# Patient Record
Sex: Female | Born: 1995 | Race: White | Hispanic: No | Marital: Single | Smoking: Current every day smoker
Health system: Southern US, Community
[De-identification: ages and names within clinical notes are randomized; demographics above are authoritative.]

---
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
## 2016-01-08 ENCOUNTER — Ambulatory Visit (INDEPENDENT_AMBULATORY_CARE_PROVIDER_SITE_OTHER): Payer: Medicaid Other | Admitting: Podiatry

## 2016-01-08 ENCOUNTER — Encounter: Payer: Self-pay | Admitting: Podiatry

## 2016-01-08 VITALS — BP 98/71 | HR 99 | Resp 16 | Ht 67.0 in | Wt 110.0 lb

## 2016-01-08 DIAGNOSIS — L923 Foreign body granuloma of the skin and subcutaneous tissue: Secondary | ICD-10-CM | POA: Diagnosis not present

## 2016-01-08 NOTE — Progress Notes (Signed)
   Subjective:    Patient ID: Crystal Crosby, female    DOB: 04/23/1996, 20 y.o.   MRN: [id number]  HPI Patient presents with foot pain in their right foot-medial (near arch). Pt stated, "Stepped on glass 4-5 yrs ago; Pain on and off"; went to ED on 11/04/15.  Patient had x-rays taken at [facility] on 11/04/15.    Review of Systems  All other systems reviewed and are negative.      Objective:   Physical Exam        Assessment & Plan:

## 2016-01-10 NOTE — Progress Notes (Signed)
Subjective:     Patient ID: Crystal Crosby, female   DOB: [name], 20 y.o.   MRN: [id number]  HPI patient presents stating that she stepped on glass for 5 years ago and that she has developed recently pain in the inside of her right arch with inflammation and no drainage. She states a small bump has come up recently and it's tender to wear shoes and was referred by the emergency room   Review of Systems  All other systems reviewed and are negative.      Objective:   Physical Exam  Constitutional: She is oriented to person, place, and time.  Cardiovascular: Intact distal pulses.   Musculoskeletal: Normal range of motion.  Neurological: She is oriented to person, place, and time.  Skin: Skin is warm.  Nursing note and vitals reviewed.  neurovascular status found to be intact muscle strength adequate range of motion within normal limits. Patient's found have good digital perfusion is well oriented 3 with well-developed arch. On the medial side of the right arch there is a small protrusion and within this there is slight redness and it is painful when pressed. When x-rays evaluated it appears to be related to one large piece of glass which is probably moved and is protruding. Patient has several other small pieces of glass that are not painful     Assessment:     Probable movement of a piece of glass which is creating irritation of tissue especially with compression    Plan:     H&P and her x-rays reviewed with her. I explained and educated her on condition and consideration for glass removal and due to my schedule not been available for the next several weeks I am referring her to Dr. Marylene LandStover for evaluation and treatment and probable glass removal. Patient is scheduled for Monday to see Dr. Marylene LandStover

## 2016-01-11 ENCOUNTER — Ambulatory Visit: Payer: Medicaid Other | Admitting: Sports Medicine

## 2017-01-07 IMAGING — DX DG FOOT COMPLETE 3+V*R*
3 series · 3 of 3 positions shown · non-contrast
Comparison: None.

CLINICAL DATA: Blister on medial aspect of right foot which
ruptured in [REDACTED]. Evaluate for gas.

EXAM:
RIGHT FOOT COMPLETE - 3+ VIEW

[foot ap]
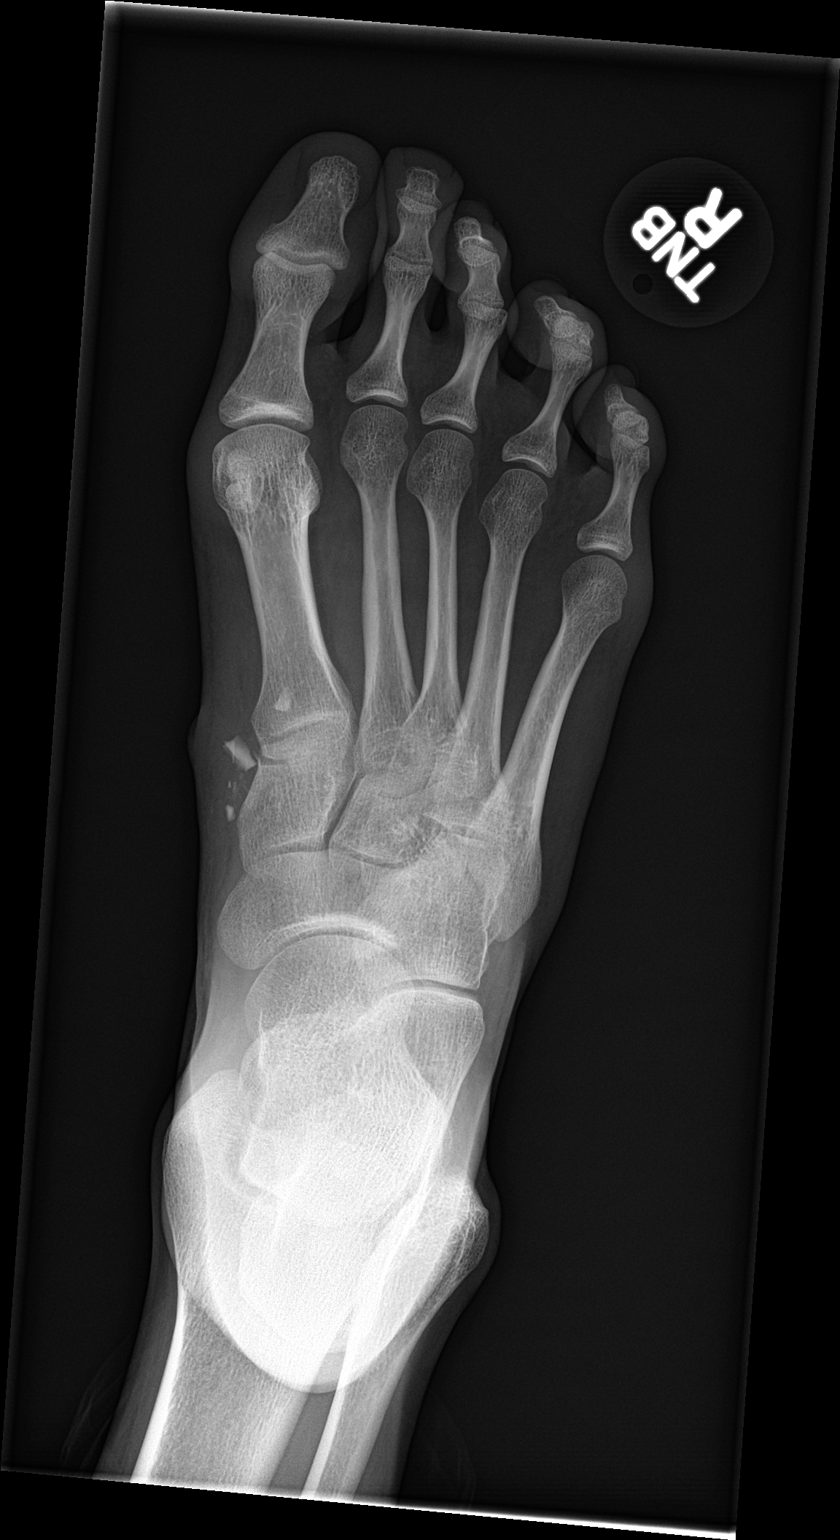

[foot obl]
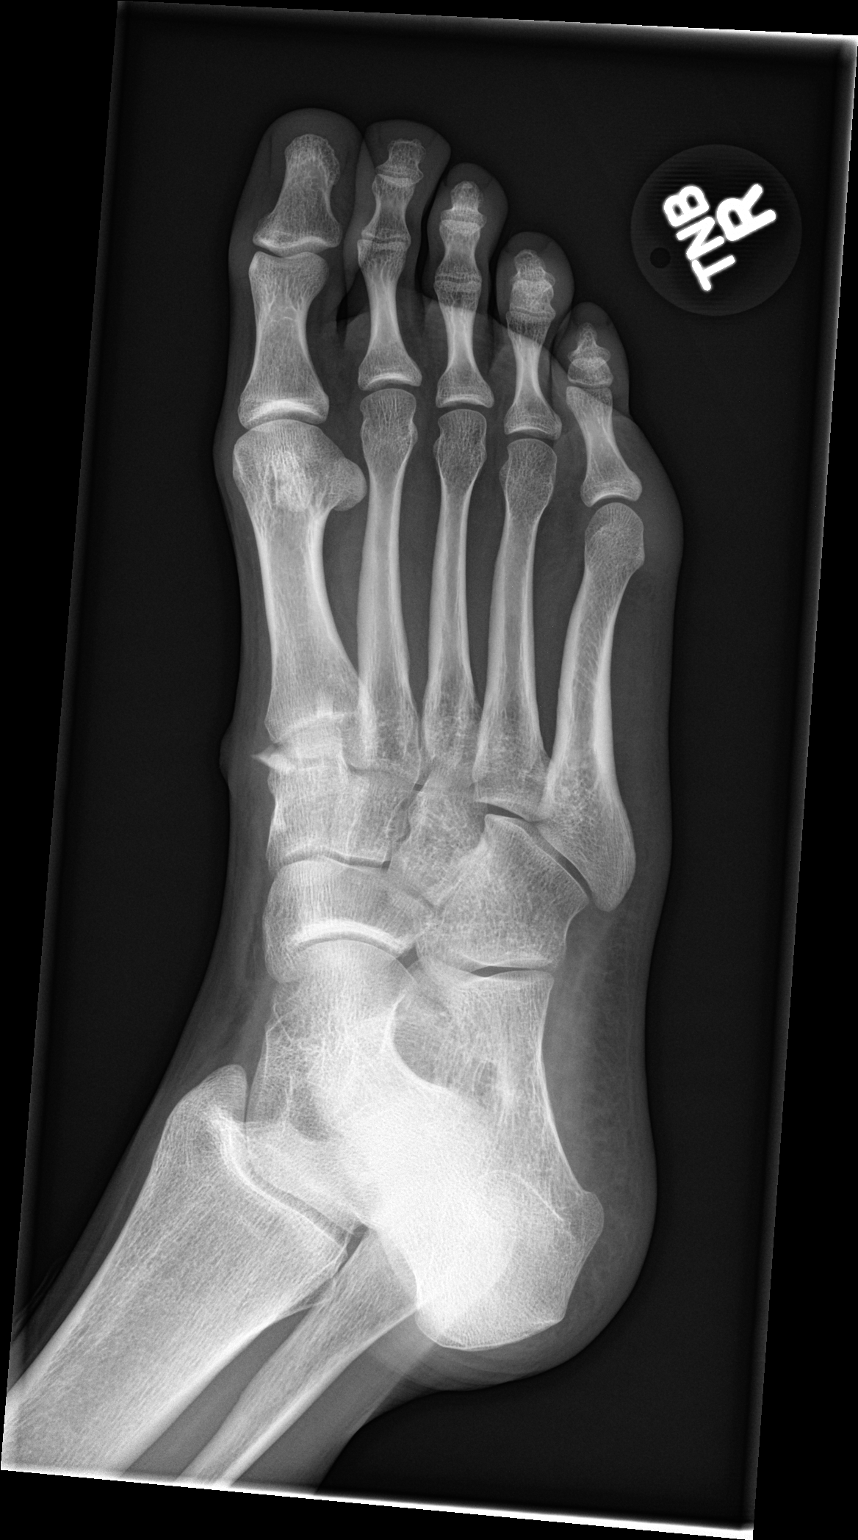

[foot lat]
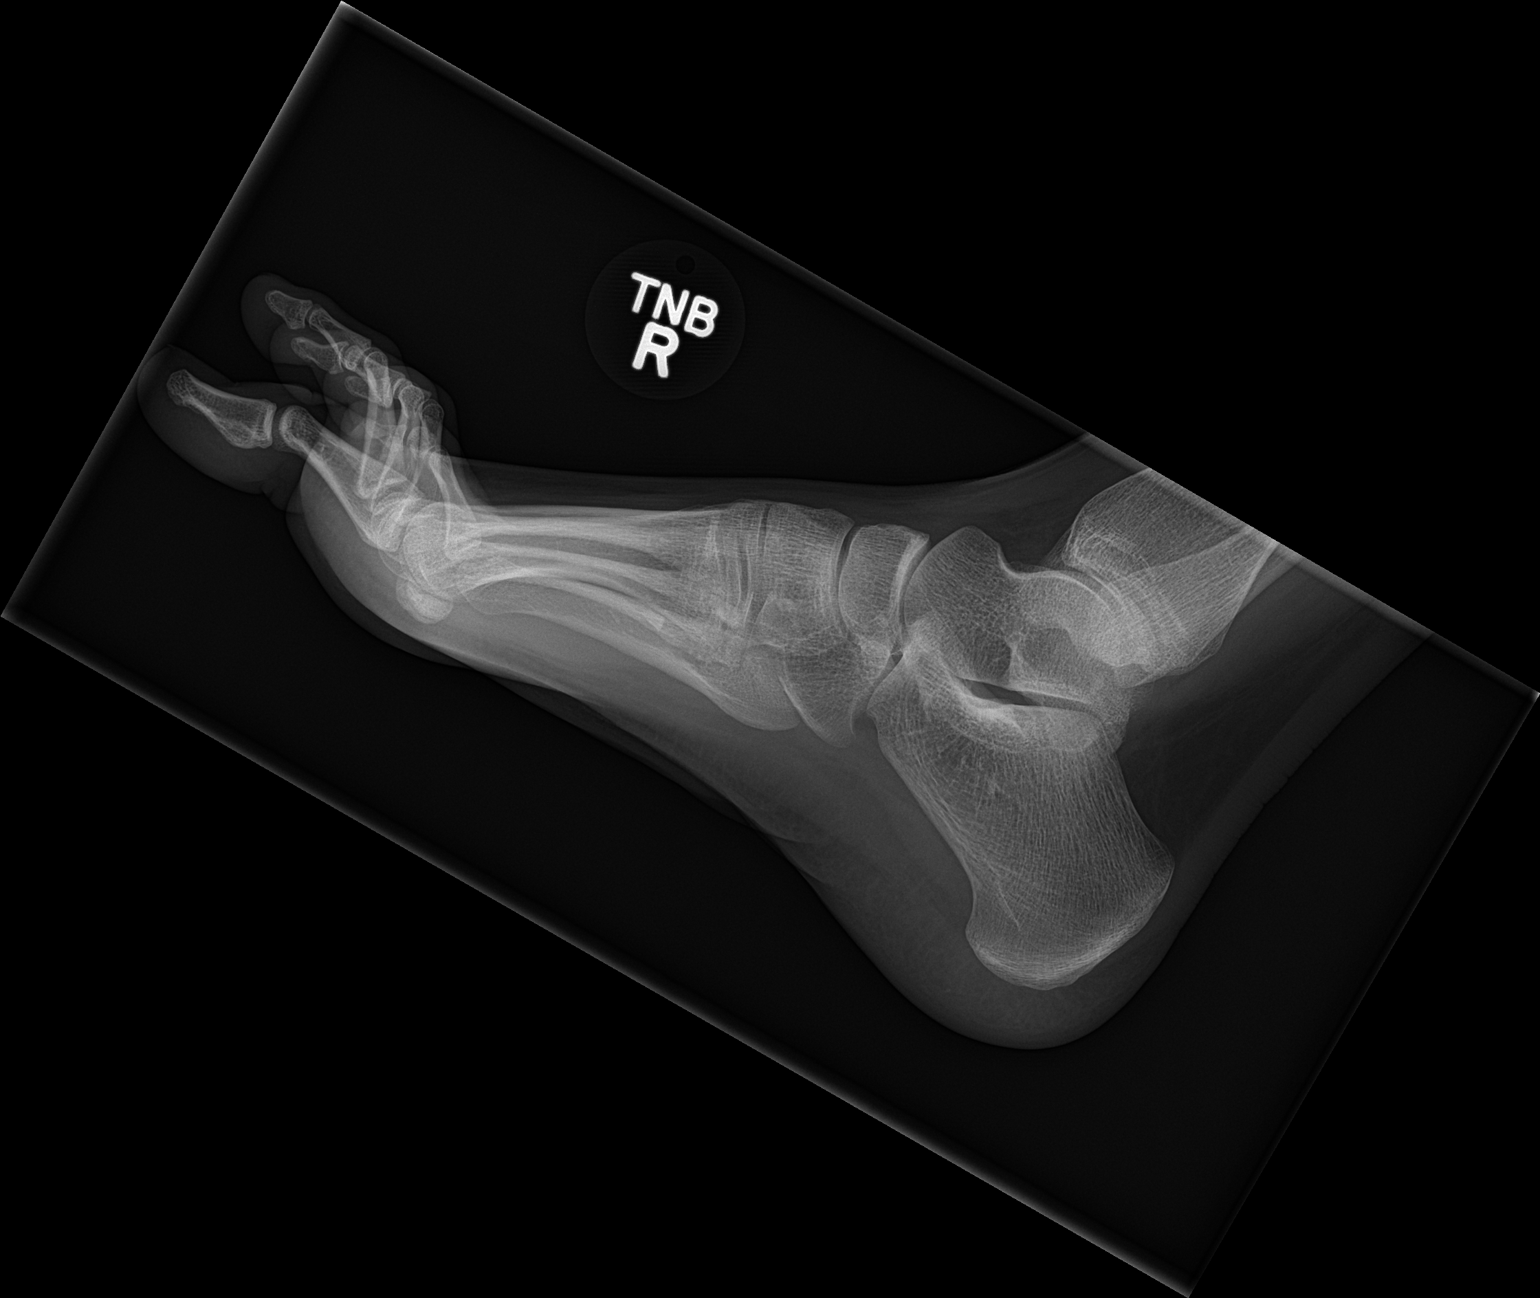

[3 of 3 positions shown; findings below may reference images not displayed]

FINDINGS: Radiopaque foreign bodies are noted within the medial soft tissues
adjacent to the medial cuneiform and proximal first metatarsal.
Overlying soft tissue swelling. No underlying acute bony
abnormality. No fracture, subluxation or dislocation. No
radiographic changes of osteomyelitis. No soft tissue gas.
IMPRESSION: Radiopaque foreign bodies within the medial midfoot soft tissues. No
acute bony abnormality.

## 2018-03-15 ENCOUNTER — Encounter (HOSPITAL_COMMUNITY): Payer: Self-pay | Admitting: Emergency Medicine

## 2018-03-15 ENCOUNTER — Other Ambulatory Visit: Payer: Self-pay

## 2018-03-15 ENCOUNTER — Emergency Department (HOSPITAL_COMMUNITY): Payer: Self-pay

## 2018-03-15 ENCOUNTER — Emergency Department (HOSPITAL_COMMUNITY)
Admission: EM | Admit: 2018-03-15 | Discharge: 2018-03-15 | Disposition: A | Discharge status: Home / Self Care | Payer: Self-pay | Attending: Emergency Medicine | Admitting: Emergency Medicine

## 2018-03-15 DIAGNOSIS — M795 Residual foreign body in soft tissue: Secondary | ICD-10-CM | POA: Insufficient documentation

## 2018-03-15 DIAGNOSIS — Z23 Encounter for immunization: Secondary | ICD-10-CM | POA: Insufficient documentation

## 2018-03-15 DIAGNOSIS — S90851A Superficial foreign body, right foot, initial encounter: Secondary | ICD-10-CM

## 2018-03-15 DIAGNOSIS — Z79899 Other long term (current) drug therapy: Secondary | ICD-10-CM | POA: Insufficient documentation

## 2018-03-15 DIAGNOSIS — L089 Local infection of the skin and subcutaneous tissue, unspecified: Secondary | ICD-10-CM | POA: Insufficient documentation

## 2018-03-15 DIAGNOSIS — Z1881 Retained glass fragments: Secondary | ICD-10-CM | POA: Insufficient documentation

## 2018-03-15 LAB — CBC WITH DIFFERENTIAL/PLATELET
ABS IMMATURE GRANULOCYTES: 0 10*3/uL (ref 0.0–0.1)
BASOS ABS: 0 10*3/uL (ref 0.0–0.1)
Basophils Relative: 0 %
Eosinophils Absolute: 0.1 10*3/uL (ref 0.0–0.7)
Eosinophils Relative: 1 %
HCT: 39.5 % (ref 36.0–46.0)
HEMOGLOBIN: 13.2 g/dL (ref 12.0–15.0)
Immature Granulocytes: 0 %
LYMPHS PCT: 16 %
Lymphs Abs: 1.7 10*3/uL (ref 0.7–4.0)
MCH: 29.3 pg (ref 26.0–34.0)
MCHC: 33.4 g/dL (ref 30.0–36.0)
MCV: 87.8 fL (ref 78.0–100.0)
MONO ABS: 0.6 10*3/uL (ref 0.1–1.0)
MONOS PCT: 5 %
Neutro Abs: 8.6 10*3/uL — ABNORMAL HIGH (ref 1.7–7.7)
Neutrophils Relative %: 78 %
PLATELETS: 157 10*3/uL (ref 150–400)
RBC: 4.5 MIL/uL (ref 3.87–5.11)
RDW: 12.5 % (ref 11.5–15.5)
WBC: 11.1 10*3/uL — ABNORMAL HIGH (ref 4.0–10.5)

## 2018-03-15 LAB — POC URINE PREG, ED: PREG TEST UR: NEGATIVE

## 2018-03-15 MED ORDER — DOXYCYCLINE HYCLATE 100 MG PO CAPS: 100.0000 mg | ORAL_CAPSULE | Freq: Two times a day (BID) | ORAL | 0 refills | Status: DC

## 2018-03-15 MED ORDER — IBUPROFEN 600 MG PO TABS: 600.0000 mg | ORAL_TABLET | Freq: Four times a day (QID) | ORAL | 0 refills | Status: DC | PRN

## 2018-03-15 MED ORDER — LIDOCAINE-EPINEPHRINE (PF) 2 %-1:200000 IJ SOLN
10.0000 mL | Freq: Once | INTRAMUSCULAR | Status: AC
  Administered 2018-03-15: 10 mL via INTRADERMAL
  Filled 2018-03-15: qty 20

## 2018-03-15 MED ORDER — TETANUS-DIPHTH-ACELL PERTUSSIS 5-2.5-18.5 LF-MCG/0.5 IM SUSP
0.5000 mL | Freq: Once | INTRAMUSCULAR | Status: AC
  Administered 2018-03-15: 0.5 mL via INTRAMUSCULAR
  Filled 2018-03-15: qty 0.5

## 2018-03-15 NOTE — ED Notes (Signed)
Patient states she cut her foot 7 years ago had sutures , states there was glass that was left in her foot. States she has has problems off and on. Hit her foot yest and it started hurting. Small wound to the top of her foot.

## 2018-03-15 NOTE — ED Provider Notes (Signed)
[facility] EMERGENCY DEPARTMENT Provider Note   CSN: [id number] Arrival date & time: 03/15/18  7829     History   Chief Complaint Chief Complaint  Patient presents with  . Wound Infection    HPI Crystal Crosby is a 22 y.o. female.  HPI   22 year old female here with for evaluation of right foot pain.  Patient mention she injured her foot approximately 7-8 years ago with retained glass within the foot.  It has never been removed.  She was swimming in a river yesterday and actually struck her foot against a hard surface and since then she has had pain.  Pain is sharp throbbing 10 out of 10, kept her up at night.  She also noticed liquid oozing from the site.  Increasing pain with palpation.  Pain is nonradiating.  No associated fever, chills.  She is not up-to-date with tetanus.  Mom who is at bedside requests to have the retained shards of glass to be removed.    History reviewed. No pertinent past medical history.  Patient Active Problem List   Diagnosis Date Noted  . Breakthrough bleeding on Nexplanon 01/28/2015    History reviewed. No pertinent surgical history.   OB History   None      Home Medications    Prior to Admission medications   Medication Sig Start Date End Date Taking? Authorizing Provider  etonogestrel (NEXPLANON) 68 MG IMPL implant Inject 1 each into the skin once.    [provider]  megestrol (MEGACE) 40 MG tablet Take 3 tablets (120 mg total) by mouth daily. Patient taking differently: Take 80 mg by mouth daily as needed (during menstrual for bleeding).  01/28/15   Jacklyn Shell, CNM    Family History Family History  Problem Relation Age of Onset  . Other Mother        abnormal pap  . COPD Mother   . Thyroid disease Maternal Grandmother   . Diabetes Maternal Grandfather   . Asthma Maternal Grandfather   . Asthma Maternal Aunt     Social History Social History   Tobacco Use  . Smoking status:  Current Every Day Smoker    Packs/day: 0.50    Types: Cigarettes  . Smokeless tobacco: Never Used  Substance Use Topics  . Alcohol use: No  . Drug use: No     Allergies   Patient has no known allergies.   Review of Systems Review of Systems  Constitutional: Negative for fever.  Skin: Positive for wound. Negative for rash.  Neurological: Negative for numbness.     Physical Exam Updated Vital Signs BP 117/69   Pulse 82   Temp 98.5 F (36.9 C) (Oral)   Resp 16   Ht  (1.676 m)   Wt 61.2 kg (135 lb)   SpO2 99%   BMI 21.79 kg/m   Physical Exam  Constitutional: She appears well-developed and well-nourished. No distress.  HENT:  Head: Atraumatic.  Eyes: Conjunctivae are normal.  Neck: Neck supple.  Musculoskeletal: She exhibits tenderness (Right foot: There is an area of erythema less than 1 cm noted to the side of foot medially oozing out serous fluid and tender to palpation.  No obvious foreign body visualized.  No joint involvement.).  Neurological: She is alert.  Skin: No rash noted.  Psychiatric: She has a normal mood and affect.  Nursing note and vitals reviewed.    ED Treatments / Results  Labs (all labs ordered are  listed, but only abnormal results are displayed) Labs Reviewed  CBC WITH DIFFERENTIAL/PLATELET - Abnormal; Notable for the following components:      Result Value   WBC 11.1 (*)    Neutro Abs 8.6 (*)    All other components within normal limits    EKG None  Radiology Dg Foot Complete Right  Result Date: 03/15/2018 CLINICAL DATA:  Recent injury.  History of glass in foot 7 years ago EXAM: RIGHT FOOT COMPLETE - 3+ VIEW COMPARISON:  11/04/2015 FINDINGS: Negative for fracture.  Negative for arthropathy Several fragments of glass are present in the soft tissues medial to the first tarsal metatarsal joint. 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 Negative for fracture. Electronically Signed   By: Marlan Palau  M.D.   On: 03/15/2018 09:34    Procedures .Foreign Body Removal Date/Time: 03/15/2018 11:44 AM Performed by: Fayrene Helper, PA-C Authorized by: Fayrene Helper, PA-C  Consent: Verbal consent obtained. Risks and benefits: risks, benefits and alternatives were discussed Consent given by: patient Patient understanding: patient states understanding of the procedure being performed Patient consent: the patient's understanding of the procedure matches consent given Imaging studies: imaging studies available Patient identity confirmed: verbally with patient Time out: Immediately prior to procedure a "time out" was called to verify the correct patient, procedure, equipment, support staff and site/side marked as required. Intake: R foot.  Anesthesia: Local Anesthetic: lidocaine 2% with epinephrine Anesthetic total: 6 mL  Sedation: Patient sedated: no  Patient restrained: no Patient cooperative: yes Complexity: simple 1 objects recovered. Objects recovered: shard of glass Post-procedure assessment: residual foreign bodies remain Patient tolerance: Patient tolerated the procedure well with no immediate complications   (including critical care time)  Medications Ordered in ED Medications  Tdap (BOOSTRIX) injection 0.5 mL (0.5 mLs Intramuscular Given 03/15/18 1113)  lidocaine-EPINEPHrine (XYLOCAINE W/EPI) 2 %-1:200000 (PF) injection 10 mL (10 mLs Intradermal Given by Other 03/15/18 1113)     Initial Impression / Assessment and Plan / ED Course  I have reviewed the triage vital signs and the nursing notes.  Pertinent labs & imaging results that were available during my care of the patient were reviewed by me and considered in my medical decision making (see chart for details).     BP 117/69   Pulse 82   Temp 98.5 F (36.9 C) (Oral)   Resp 16   Ht  (1.676 m)   Wt 61.2 kg (135 lb)   SpO2 99%   BMI 21.79 kg/m    Final Clinical Impressions(s) / ED Diagnoses   Final diagnoses:   Foreign body in right foot with infection, initial encounter    ED Discharge Orders        Ordered    doxycycline (VIBRAMYCIN) 100 MG capsule  2 times daily     03/15/18 1246    ibuprofen (ADVIL,MOTRIN) 600 MG tablet  Every 6 hours PRN     03/15/18 1246     11:46 AM Pt with prior retained shards of glass in her R foot which recently irritate her skin and cause localize infection.  Xray confirms multiple shards of glass in her foot.  After I&D procedure I was able to extra 1 shard of glass measuring approximately 8mm in length.  It was approximately 0.5cm in depth from the skin surface. No purulent drainage noted.   Pt discharge home with antibiotic, and pain medication.  Referral to podiatry given.  Pt is aware that's there are more retain  glass in the foot.  Return precaution discussed.   12:48 PM Pregnancy test negative.    Fayrene Helper, PA-C 03/15/18 1248    Arby Barrette, MD 03/23/18 [phone]

## 2018-03-15 NOTE — ED Notes (Signed)
Patient attempting to provide a urine sample.

## 2018-03-15 NOTE — Discharge Instructions (Signed)
Please follow up with podiatrist for further management of your retained shards of glass in right foot.  Take antibiotic as prescribed for the full duration.  Cleanse wound and change dressing daily until healed.  Return if you have any concerns.

## 2018-03-15 NOTE — ED Triage Notes (Signed)
Pt arrives via POV from home with recurrent right foot wound infection. Per family got glass in the foot 2 years ago. Glass was never removed. Pt states yesterday she hit her foot and now has oozing purulent wound to right foot. Distal circulation intact. VSS. Pt awake, alert, appropriate.

## 2019-04-12 ENCOUNTER — Emergency Department (HOSPITAL_COMMUNITY)
Admission: EM | Admit: 2019-04-12 | Discharge: 2019-04-13 | Disposition: A | Discharge status: Home / Self Care | Payer: Medicaid Other | Attending: Emergency Medicine | Admitting: Emergency Medicine

## 2019-04-12 ENCOUNTER — Other Ambulatory Visit: Payer: Self-pay

## 2019-04-12 DIAGNOSIS — F1320 Sedative, hypnotic or anxiolytic dependence, uncomplicated: Secondary | ICD-10-CM | POA: Insufficient documentation

## 2019-04-12 DIAGNOSIS — F17210 Nicotine dependence, cigarettes, uncomplicated: Secondary | ICD-10-CM | POA: Insufficient documentation

## 2019-04-12 DIAGNOSIS — F411 Generalized anxiety disorder: Secondary | ICD-10-CM | POA: Insufficient documentation

## 2019-04-12 DIAGNOSIS — T50901A Poisoning by unspecified drugs, medicaments and biological substances, accidental (unintentional), initial encounter: Secondary | ICD-10-CM | POA: Diagnosis present

## 2019-04-12 DIAGNOSIS — Z79899 Other long term (current) drug therapy: Secondary | ICD-10-CM | POA: Insufficient documentation

## 2019-04-12 DIAGNOSIS — F1120 Opioid dependence, uncomplicated: Secondary | ICD-10-CM | POA: Insufficient documentation

## 2019-04-12 DIAGNOSIS — T50904A Poisoning by unspecified drugs, medicaments and biological substances, undetermined, initial encounter: Secondary | ICD-10-CM

## 2019-04-12 LAB — COMPREHENSIVE METABOLIC PANEL
ALT: 12 U/L (ref 0–44)
AST: 16 U/L (ref 15–41)
Albumin: 4.6 g/dL (ref 3.5–5.0)
Alkaline Phosphatase: 59 U/L (ref 38–126)
Anion gap: 10 (ref 5–15)
BUN: 15 mg/dL (ref 6–20)
CO2: 24 mmol/L (ref 22–32)
Calcium: 8.9 mg/dL (ref 8.9–10.3)
Chloride: 105 mmol/L (ref 98–111)
Creatinine, Ser: 0.91 mg/dL (ref 0.44–1.00)
GFR calc Af Amer: >60 mL/min (ref >60)
GFR calc non Af Amer: >60 mL/min (ref >60)
Glucose, Bld: 112 mg/dL — ABNORMAL HIGH (ref 70–99)
Potassium: 3.5 mmol/L (ref 3.5–5.1)
Sodium: 139 mmol/L (ref 135–145)
Total Bilirubin: 0.6 mg/dL (ref 0.3–1.2)
Total Protein: 6.8 g/dL (ref 6.5–8.1)

## 2019-04-12 LAB — ACETAMINOPHEN LEVEL: Acetaminophen (Tylenol), Serum: <10 ug/mL — ABNORMAL LOW (ref 10–30)

## 2019-04-12 LAB — RAPID URINE DRUG SCREEN, HOSP PERFORMED
Amphetamines: NOT DETECTED
Barbiturates: NOT DETECTED
Benzodiazepines: POSITIVE — AB
Cocaine: NOT DETECTED
Opiates: NOT DETECTED
Tetrahydrocannabinol: NOT DETECTED

## 2019-04-12 LAB — CBC WITH DIFFERENTIAL/PLATELET
Abs Immature Granulocytes: 0.02 10*3/uL (ref 0.00–0.07)
Basophils Absolute: 0.1 10*3/uL (ref 0.0–0.1)
Basophils Relative: 1 %
Eosinophils Absolute: 0.4 10*3/uL (ref 0.0–0.5)
Eosinophils Relative: 3 %
HCT: 39.9 % (ref 36.0–46.0)
Hemoglobin: 13.3 g/dL (ref 12.0–15.0)
Immature Granulocytes: 0 %
Lymphocytes Relative: 41 %
Lymphs Abs: 4.7 10*3/uL — ABNORMAL HIGH (ref 0.7–4.0)
MCH: 30.2 pg (ref 26.0–34.0)
MCHC: 33.3 g/dL (ref 30.0–36.0)
MCV: 90.7 fL (ref 80.0–100.0)
Monocytes Absolute: 0.7 10*3/uL (ref 0.1–1.0)
Monocytes Relative: 7 %
Neutro Abs: 5.4 10*3/uL (ref 1.7–7.7)
Neutrophils Relative %: 48 %
Platelets: 245 10*3/uL (ref 150–400)
RBC: 4.4 MIL/uL (ref 3.87–5.11)
RDW: 12.3 % (ref 11.5–15.5)
WBC: 11.2 10*3/uL — ABNORMAL HIGH (ref 4.0–10.5)
nRBC: 0 % (ref 0.0–0.2)

## 2019-04-12 LAB — HCG, SERUM, QUALITATIVE: Preg, Serum: NEGATIVE

## 2019-04-12 LAB — PROTIME-INR
INR: 1 (ref 0.8–1.2)
Prothrombin Time: 13.3 seconds (ref 11.4–15.2)

## 2019-04-12 LAB — SALICYLATE LEVEL: Salicylate Lvl: <7 mg/dL (ref 2.8–30.0)

## 2019-04-12 LAB — ETHANOL: Alcohol, Ethyl (B): <10 mg/dL (ref <10)

## 2019-04-12 MED ORDER — NALOXONE HCL 4 MG/0.1ML NA LIQD
1.0000 | Freq: Once | NASAL | Status: AC
  Administered 2019-04-12: 1 via NASAL

## 2019-04-12 MED ORDER — SODIUM CHLORIDE 0.9 % IV BOLUS
1000.0000 mL | Freq: Once | INTRAVENOUS | Status: AC
  Administered 2019-04-12: 1000 mL via INTRAVENOUS

## 2019-04-12 NOTE — ED Notes (Addendum)
Pt informed that she was IVC'd, had to change into purple scrubs and give up belongings, and the process of being on a psych hold. Pt was wanded by security and moved to room 17.

## 2019-04-12 NOTE — ED Notes (Signed)
IVC paperwork faxed @ 2158. Spoke with Magistrate at 2201.

## 2019-04-12 NOTE — ED Provider Notes (Signed)
[facility] EMERGENCY DEPARTMENT Provider Note   CSN: [id number] Arrival date & time: 04/12/19  2100     History   Chief Complaint Chief Complaint  Patient presents with  . Drug Overdose    HPI Crystal Crosby is a 23 y.o. female.     The history is provided by the patient and a parent. The history is limited by the condition of the patient (acuity of condition).  Drug Overdose   Pt was seen at 2100.  Pt brought in by her mother: Pt's mother picked her up from a friend's house and while they were driving in the car, pt became "unresponsive." Pt's mother "thinks she might have smoked a joint." Pt somnolent on arrival.   No past medical history on file.  Patient Active Problem List   Diagnosis Date Noted  . Breakthrough bleeding on Nexplanon 01/28/2015    No past surgical history on file.   OB History   No obstetric history on file.      Home Medications    Prior to Admission medications   Medication Sig Start Date End Date Taking? Authorizing Provider  doxycycline (VIBRAMYCIN) 100 MG capsule Take 1 capsule (100 mg total) by mouth 2 (two) times daily. One po bid x 7 days 03/15/18   Fayrene Helperran, [place], PA-C  etonogestrel (NEXPLANON) 68 MG IMPL implant Inject 1 each into the skin once.    [provider]  ibuprofen (ADVIL,MOTRIN) 600 MG tablet Take 1 tablet (600 mg total) by mouth every 6 (six) hours as needed. 03/15/18   Fayrene Helperran, [place], PA-C  megestrol (MEGACE) 40 MG tablet Take 3 tablets (120 mg total) by mouth daily. Patient taking differently: Take 80 mg by mouth daily as needed (during menstrual for bleeding).  01/28/15   Jacklyn Shellresenzo-Dishmon, Frances, CNM    Family History Family History  Problem Relation Age of Onset  . Other Mother        abnormal pap  . COPD Mother   . Thyroid disease Maternal Grandmother   . Diabetes Maternal Grandfather   . Asthma Maternal Grandfather   . Asthma Maternal Aunt     Social History Social History   Tobacco Use  . Smoking  status: Current Every Day Smoker    Packs/day: 0.50    Types: Cigarettes  . Smokeless tobacco: Never Used  Substance Use Topics  . Alcohol use: No  . Drug use: No     Allergies   Patient has no known allergies.   Review of Systems Review of Systems  Unable to perform ROS: Acuity of condition     Physical Exam Updated Vital Signs BP 136/90 (BP Location: Left Arm)   Pulse (!) 116   Temp 98.1 F (36.7 C) (Oral)   Resp 18   Ht 5\' 6"  (1.676 m)   Wt 54.4 kg   SpO2 100%   BMI 19.37 kg/m    Patient Vitals for the past 24 hrs:  BP Temp Temp src Pulse Resp SpO2 Height Weight  04/12/19 2109 - - - - - - 5\' 6"  (1.676 m) 54.4 kg  04/12/19 2106 136/90 98.1 F (36.7 C) Oral (!) 116 18 100 % - -     Physical Exam 2105: Physical examination:  Nursing notes reviewed; Vital signs and O2 SAT reviewed;  Constitutional: Well developed, Well nourished, Well hydrated, In no acute distress; Head:  Normocephalic, atraumatic; Eyes: EOMI, PERRL, No scleral icterus; ENMT: Mouth and pharynx normal, Mucous membranes moist; Neck: Supple, Full range of  motion; Cardiovascular: Regular rate and rhythm, No gallop; Respiratory: Breath sounds clear & equal bilaterally, No wheezes. Shallow respiratory effort/excursion; Chest: Nontender, Movement normal; Abdomen: Soft, Nontender, Nondistended, Normal bowel sounds; Genitourinary: No CVA tenderness; Extremities: Peripheral pulses normal, No tenderness, No edema, No calf edema or asymmetry.; Neuro: Somnolent. Eyes closed. Intermittently moaning. No facial droop. Moves extremities on stretcher only..; Skin: Color normal, Warm, Dry.   ED Treatments / Results  Labs (all labs ordered are listed, but only abnormal results are displayed)   EKG EKG Interpretation  Date/Time:  Friday April 12 2019 21:05:37 EDT Ventricular Rate:  123 PR Interval:    QRS Duration: 93 QT Interval:  326 QTC Calculation: 467 R Axis:   97 Text Interpretation:  Sinus tachycardia  Consider right ventricular hypertrophy Borderline ST depression, lateral leads Baseline wander Artifact No old tracing to compare Confirmed by Samuel JesterMcManus, Tej Murdaugh [phone]([id number]) on 04/12/2019 10:01:08 PM   Radiology   Procedures Procedures (including critical care time)  Medications Ordered in ED Medications  naloxone (NARCAN) nasal spray 4 mg/0.1 mL (1 spray Nasal Provided for home use 04/12/19 2112)  sodium chloride 0.9 % bolus 1,000 mL (0 mLs Intravenous Stopped 04/12/19 2127)     Initial Impression / Assessment and Plan / ED Course  I have reviewed the triage vital signs and the nursing notes.  Pertinent labs & imaging results that were available during my care of the patient were reviewed by me and considered in my medical decision making (see chart for details).     MDM Reviewed: previous chart, nursing note and vitals Interpretation: labs   Results for orders placed or performed during the hospital encounter of 04/12/19  Acetaminophen level  Result Value Ref Range   Acetaminophen (Tylenol), Serum <10 (L) 10 - 30 ug/mL  Comprehensive metabolic panel  Result Value Ref Range   Sodium 139 135 - 145 mmol/L   Potassium 3.5 3.5 - 5.1 mmol/L   Chloride 105 98 - 111 mmol/L   CO2 24 22 - 32 mmol/L   Glucose, Bld 112 (H) 70 - 99 mg/dL   BUN 15 6 - 20 mg/dL   Creatinine, Ser 4.090.91 0.44 - 1.00 mg/dL   Calcium 8.9 8.9 - 81.110.3 mg/dL   Total Protein 6.8 6.5 - 8.1 g/dL   Albumin 4.6 3.5 - 5.0 g/dL   AST 16 15 - 41 U/L   ALT 12 0 - 44 U/L   Alkaline Phosphatase 59 38 - 126 U/L   Total Bilirubin 0.6 0.3 - 1.2 mg/dL   GFR calc non Af Amer >60 >60 mL/min   GFR calc Af Amer >60 >60 mL/min   Anion gap 10 5 - 15  Ethanol  Result Value Ref Range   Alcohol, Ethyl (B) <10 <10 mg/dL  Salicylate level  Result Value Ref Range   Salicylate Lvl <7.0 2.8 - 30.0 mg/dL  CBC with Differential  Result Value Ref Range   WBC 11.2 (H) 4.0 - 10.5 K/uL   RBC 4.40 3.87 - 5.11 MIL/uL   Hemoglobin 13.3 12.0  - 15.0 g/dL   HCT 91.439.9 78.236.0 - 95.646.0 %   MCV 90.7 80.0 - 100.0 fL   MCH 30.2 26.0 - 34.0 pg   MCHC 33.3 30.0 - 36.0 g/dL   RDW 21.312.3 08.611.5 - 57.815.5 %   Platelets 245 150 - 400 K/uL   nRBC 0.0 0.0 - 0.2 %   Neutrophils Relative % 48 %   Neutro Abs 5.4 1.7 - 7.7 K/uL  Lymphocytes Relative 41 %   Lymphs Abs 4.7 (H) 0.7 - 4.0 K/uL   Monocytes Relative 7 %   Monocytes Absolute 0.7 0.1 - 1.0 K/uL   Eosinophils Relative 3 %   Eosinophils Absolute 0.4 0.0 - 0.5 K/uL   Basophils Relative 1 %   Basophils Absolute 0.1 0.0 - 0.1 K/uL   Immature Granulocytes 0 %   Abs Immature Granulocytes 0.02 0.00 - 0.07 K/uL  Protime-INR  Result Value Ref Range   Prothrombin Time 13.3 11.4 - 15.2 seconds   INR 1.0 0.8 - 1.2  hCG, serum, qualitative  Result Value Ref Range   Preg, Serum NEGATIVE NEGATIVE    2110:  Intranasal narcan given with good effect. Pt now awake/alert, resps easy, NAD. When asked what she took, pt stated "roxi's" and "xanax." Pt not forthcoming as to why she took these medications (ie: to get "high" vs SI/SA). Pt only started to cry. Pt's mother in waiting room: states to ED RN and I that pt "might have just smoked a joint" and "she has scoliosis."   2120:  Pt trying to leave ED. Pt continues to not tell staff why she took meds and is now even stating she "doesn't know what the pills were."  IVC paperwork completed.   2305:  TTS pending. Sign out to Dr. Christy Gentles.    Final Clinical Impressions(s) / ED Diagnoses   Final diagnoses:  None    ED Discharge Orders    None       Francine Graven, DO 04/12/19 2314

## 2019-04-12 NOTE — BH Assessment (Addendum)
Tele Assessment Note   Patient Name: Crystal [facility] MRN: [id number] Referring Physician: Dr. Samuel JesterKathleen McManus, DO [facility] Location of Provider: [facility] TTS Department  Crystal HoesMariah B [facility] is a 23 y.o. female who was brought to [facility] by her mother due to becoming unresponsive while riding in the car with her. Pt's mother informed the hospital that pt had been at a friend's house and she picked pt up and that pt suddenly became unresponsive; she stated she thought pt had possibly smoked marijuana. After pt was provided Narcan and coming-to, she informed hospital staff that she had taken Xanax and Roxys, though she now states that she didn't take these things.  Pt denies SI or any history of SI; she denies any prior attempts to kill herself or any prior hospitalizations. Pt denies HI, AVH, NSSIB, and access to guns/weapons. Pt states she is currently on unsupervised probation due to being charge with a DUI approximately 8 months ago due to driving while under the influence of EtOH. Pt shares she smokes marijuana infrequently now due to this.  Pt's mother shares "I just want her to come home," in regards to pt. Clinician requested pt's mother to share if she happens to know any information in regards to what may have occurred to day. Pt's mother shared "she shared she smoked a joint and said they put something in it." Inquired as to whether pt's mother knows what substance or how much, but pt's mother stated that, no, she was unaware and that she wants to know when to pick her daughter up from the hospital. Explained that a PA will be making the decision in regards to when pt can leave the [facility].  Pt provided her mother's name and phone number and verbal consent for clinician to contact her for information.  Pt is oriented x4. Her recent and remote memory is impaired. Pt was, overall, cooperative throughout the assessment process. Pt's insight, judgement, and impulse control  is impaired at this time.   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  No past surgical history on file.  Family History:  Family History  Problem Relation Age of Onset  . Other Mother        abnormal pap  . COPD Mother   . Thyroid disease Maternal Grandmother   . Diabetes Maternal Grandfather   . Asthma Maternal Grandfather   . Asthma Maternal Aunt     Social History:  reports that she has been smoking cigarettes. She has been smoking about 0.50 packs per day. She has never used smokeless tobacco. She reports that she does not drink alcohol or use drugs.  Additional Social History:  Alcohol / Drug Use Pain Medications: Please see MAR Prescriptions: Please see MAR Over the Counter: Please see MAR History of alcohol / drug use?: Yes Longest period of sobriety (when/how long): Unknown Substance #1 Name of Substance 1: Marijuana 1 - Age of First Use: 18 1 - Amount (size/oz): 1 blunt 1 - Frequency: Unknown 1 - Duration: Unknown 1 - Last Use / Amount: 04/12/2019 Substance #2 Name of Substance 2: Xanax 2 - Age of First Use: Unknown 2 - Amount (size/oz): "1 peach (05mg ) pill" 2 - Frequency: Unknown 2 - Duration: Unknown 2 - Last Use / Amount: 04/12/2019 Substance #3 Name of Substance 3: Roxycodone 3 - Age of First  Use: Unknown 3 - Amount (size/oz): Unknown 3 - Frequency: Unknown 3 - Duration: Unknown 3 - Last Use / Amount: 04/12/2019  CIWA: CIWA-Ar BP: 136/90 Pulse Rate: (!) 116 COWS:    Allergies: No Known Allergies  Home Medications: (Not in a hospital admission)   OB/GYN Status:  No LMP recorded. Patient has had an implant.  General Assessment Data Location of Assessment: [facility] Assessment: In system Is this a Tele or Face-to-Face Assessment?: Tele Assessment Is this an Initial Assessment or a  Re-assessment for this encounter?: Initial Assessment Patient Accompanied by:: N/A Language Other than English: No Living Arrangements: Other (Comment)(Pt lives with her grandmother) What gender do you identify as?: Female Marital status: Single Maiden name: Museum/gallery curator Pregnancy Status: No Living Arrangements: Other relatives Can pt return to current living arrangement?: Yes Admission Status: Involuntary Petitioner: [facility] Attending Is patient capable of signing voluntary admission?: No Referral Source: MD Insurance type: None     Crisis Care Plan Living Arrangements: Other relatives Legal Guardian: Other:(Self) Name of Psychiatrist: None Name of Therapist: None  Education Status Is patient currently in school?: No Is the patient employed, unemployed or receiving disability?: Unemployed  Risk to self with the past 6 months Suicidal Ideation: No Has patient been a risk to self within the past 6 months prior to admission? : No Suicidal Intent: No Has patient had any suicidal intent within the past 6 months prior to admission? : No Is patient at risk for suicide?: No Suicidal Plan?: No Has patient had any suicidal plan within the past 6 months prior to admission? : No Access to Means: No What has been your use of drugs/alcohol within the last 12 months?: Pt acknowledges marijuana use; denies any other SA, though she admitted it upon arriving to the [facility] Previous Attempts/Gestures: No How many times?: 0 Other Self Harm Risks: Pt required Narcan to reverse the effects of the substances she took Triggers for Past Attempts: None known Intentional Self Injurious Behavior: None Family Suicide History: No Recent stressful life event(s): Legal Issues, Job Loss Persecutory voices/beliefs?: No Depression: Yes Depression Symptoms: Guilt, Feeling worthless/self pity, Insomnia Substance abuse history and/or treatment for substance abuse?: Yes Suicide prevention information given to  non-admitted patients: Not applicable  Risk to Others within the past 6 months Homicidal Ideation: No Does patient have any lifetime risk of violence toward others beyond the six months prior to admission? : No Thoughts of Harm to Others: No Current Homicidal Intent: No Current Homicidal Plan: No Access to Homicidal Means: No Identified Victim: None noted History of harm to others?: No Assessment of Violence: On admission Violent Behavior Description: None noted Does patient have access to weapons?: No(Pt denies access to guns/weapons) Criminal Charges Pending?: No Does patient have a court date: No Is patient on probation?: Yes(Due to DUI of marijuana appro 8 months ago)  Psychosis Hallucinations: None noted Delusions: None noted  Mental Status Report Appearance/Hygiene: In scrubs Eye Contact: Good Motor Activity: Unremarkable Speech: Logical/coherent Level of Consciousness: Crying, Alert Mood: Anxious, Apprehensive Affect: Appropriate to circumstance Anxiety Level: Minimal Thought Processes: Circumstantial Judgement: Impaired Orientation: Person, Place, Time, Situation Obsessive Compulsive Thoughts/Behaviors: Minimal  Cognitive Functioning Concentration: Normal Memory: Recent Intact, Remote Intact Is patient IDD: No Insight: Fair Impulse Control: Poor Appetite: Good Have you had any weight changes? : No Change Sleep: No Change Total Hours of Sleep: 6 Vegetative Symptoms: None  ADLScreening [facility] Assessment Services) Patient's cognitive ability adequate to safely complete daily activities?: Yes Patient able  to express need for assistance with ADLs?: Yes Independently performs ADLs?: Yes (appropriate for developmental age)  Prior Inpatient Therapy Prior Inpatient Therapy: No  Prior Outpatient Therapy Prior Outpatient Therapy: No Does patient have an ACCT team?: No Does patient have Intensive In-House Services?  : No Does patient have [facility] services? :  No Does patient have P4CC services?: No  ADL Screening (condition at time of admission) Patient's cognitive ability adequate to safely complete daily activities?: Yes Is the patient deaf or have difficulty hearing?: No Does the patient have difficulty seeing, even when wearing glasses/contacts?: No Does the patient have difficulty concentrating, remembering, or making decisions?: No Patient able to express need for assistance with ADLs?: Yes Does the patient have difficulty dressing or bathing?: No Independently performs ADLs?: Yes (appropriate for developmental age) Does the patient have difficulty walking or climbing stairs?: No Weakness of Legs: None Weakness of Arms/Hands: None  Home Assistive Devices/Equipment Home Assistive Devices/Equipment: None  Therapy Consults (therapy consults require a physician order) PT Evaluation Needed: No OT Evalulation Needed: No SLP Evaluation Needed: No Abuse/Neglect Assessment (Assessment to be complete while patient is alone) Abuse/Neglect Assessment Can Be Completed: Yes Physical Abuse: Denies Verbal Abuse: Denies Sexual Abuse: Denies Exploitation of patient/patient's resources: Denies Self-Neglect: Denies Values / Beliefs Cultural Requests During Hospitalization: None Spiritual Requests During Hospitalization: None Consults Spiritual Care Consult Needed: No Social Work Consult Needed: No Merchant navy officerAdvance Directives (For Healthcare) Does Patient Have a Medical Advance Directive?: No Would patient like information on creating a medical advance directive?: No - Patient declined        Disposition: Donell SievertSpencer Simon, PA, reviewed pt's chart and chart ad information and determined pt should be observed overnight for safety and stability and re-assessed in the morning. This information was provided to pt's EDP, Casimiro NeedleMichael, at [phone].    This service was provided via telemedicine using a 2-way, interactive audio and video technology.  Names of all  persons participating in this telemedicine service and their role in this encounter. Name: Dow AdolphMariah "Brooke" Alben SpittleWeaver Role: Patient  Name: Sharon SellerMelissa Leiter Role: Patient's Mother  Name: Donell SievertSpencer Simon Role: Physician's Assistant  Name: Duard BradySamantha Sandralee Tarkington Role: Clinician    Ralph DowdySamantha L Dequarius Jeffries 04/12/2019 11:33 PM

## 2019-04-12 NOTE — ED Triage Notes (Signed)
Pt brought in by family member; pt states she took some "roxys"; pt's family member states she "smoked a joint"; pt states she took a xanax "earlier" today;

## 2019-04-13 DIAGNOSIS — T50901A Poisoning by unspecified drugs, medicaments and biological substances, accidental (unintentional), initial encounter: Secondary | ICD-10-CM | POA: Diagnosis present

## 2019-04-13 DIAGNOSIS — F1290 Cannabis use, unspecified, uncomplicated: Secondary | ICD-10-CM

## 2019-04-13 DIAGNOSIS — T50904A Poisoning by unspecified drugs, medicaments and biological substances, undetermined, initial encounter: Secondary | ICD-10-CM

## 2019-04-13 MED ORDER — NICOTINE 14 MG/24HR TD PT24
14.0000 mg | MEDICATED_PATCH | Freq: Once | TRANSDERMAL | Status: DC
  Administered 2019-04-13: 14 mg via TRANSDERMAL
  Filled 2019-04-13: qty 1

## 2019-04-13 NOTE — ED Notes (Signed)
Updated mom about pt status. Mom asked to speak to her daughter and I told her no since it was almost 0130 in the morning. She was upset, but stated that she understood. Told mom that I would update her when we knew more

## 2019-04-13 NOTE — ED Notes (Signed)
CONTACT INFO:  Lenna Sciara (mom): [phone]

## 2019-04-13 NOTE — ED Notes (Signed)
Patient's mother had called several times in an attempt to have patient released. Pt's mother was told that patient would need to be reassessed by [facility] before any decision could be made.

## 2019-04-13 NOTE — ED Notes (Signed)
Gave pt a tray and a drink

## 2019-04-13 NOTE — ED Provider Notes (Signed)
Has safe ride home - will be with family over weekend, is not suicidal, denies intentional OD, has normal mental status at this time.  IVC overturned   Noemi Chapel, MD 04/13/19 [phone]

## 2019-04-13 NOTE — Discharge Instructions (Signed)
ER for worsenign symptoms, suicidal thoughts or worsening depression

## 2019-04-13 NOTE — Consult Note (Signed)
Telepsych Consultation   Reason for Consult:  Drug overdose Referring Physician:  EDP Location of Patient:  Location of Provider: Behavioral Health TTS Department  Patient Identification: Crystal Crosby MRN:  [id number] Principal Diagnosis: Drug overdose Diagnosis:  Principal Problem:   Drug overdose   Total Time spent with patient: 30 minutes  Subjective:   Crystal Crosby is a 23 y.o. female patient reports that she doesn't really remember what happened last night. She states that she was smoking marijuana and then woke up in the hospital. She does admit to taking a Xanax to help with sleep. She denies any intent to harm herself or any intent on overdosing. She denies any suicidal or homicidal ideations and denies any hallucinations. She reports that she will be going out of town with her family to [place] for the weekend and will be with them all weekend.   HPI: 04/12/19 [facility] TTS Assessment: 23 y.o. female who was brought to [facility] by her mother due to becoming unresponsive while riding in the car with her. Pt's mother informed the hospital that pt had been at a friend's house and she picked pt up and that pt suddenly became unresponsive; she stated she thought pt had possibly smoked marijuana. After pt was provided Narcan and coming-to, she informed hospital staff that she had taken Xanax and Roxys, though she now states that she didn't take these things. Pt denies SI or any history of SI; she denies any prior attempts to kill herself or any prior hospitalizations. Pt denies HI, AVH, NSSIB, and access to guns/weapons. Pt states she is currently on unsupervised probation due to being charge with a DUI approximately 8 months ago due to driving while under the influence of EtOH. Pt shares she smokes marijuana infrequently now due to this. Pt's mother shares "I just want her to come home," in regards to pt. Clinician requested pt's mother to share if she happens to know any information in regards  to what may have occurred to day. Pt's mother shared "she shared she smoked a joint and said they put something in it." Inquired as to whether pt's mother knows what substance or how much, but pt's mother stated that, no, she was unaware and that she wants to know when to pick her daughter up from the hospital. Explained that a PA will be making the decision in regards to when pt can leave the ED.  Ii have seen patient via tele-psych and I have consulted with dr. Lucianne Muss. The patient does not meet inpatient criteria dn is psychiatrically cleared. The mother has been calling [facility] and [facility] requesting to take her daughter home. I have contacted Dr. Hyacinth Meeker and notified him of the recommendations.   Past Psychiatric History: marijuana use disorder, benzodiazepine abuse  Risk to Self: Suicidal Ideation: No Suicidal Intent: No Is patient at risk for suicide?: No Suicidal Plan?: No Access to Means: No What has been your use of drugs/alcohol within the last 12 months?: Pt acknowledges marijuana use; denies any other SA, though she admitted it upon arriving to the ED How many times?: 0 Other Self Harm Risks: Pt required Narcan to reverse the effects of the substances she took Triggers for Past Attempts: None known Intentional Self Injurious Behavior: None Risk to Others: Homicidal Ideation: No Thoughts of Harm to Others: No Current Homicidal Intent: No Current Homicidal Plan: No Access to Homicidal Means: No Identified Victim: None noted History of harm to others?: No Assessment of Violence: On admission Violent  Behavior Description: None noted Does patient have access to weapons?: No(Pt denies access to guns/weapons) Criminal Charges Pending?: No Does patient have a court date: No Prior Inpatient Therapy: Prior Inpatient Therapy: No Prior Outpatient Therapy: Prior Outpatient Therapy: No Does patient have an ACCT team?: No Does patient have Intensive In-House Services?  : No Does patient have  [facility] services? : No Does patient have P4CC services?: No  Past Medical History: No past medical history on file. No past surgical history on file. Family History:  Family History  Problem Relation Age of Onset  . Other Mother        abnormal pap  . COPD Mother   . Thyroid disease Maternal Grandmother   . Diabetes Maternal Grandfather   . Asthma Maternal Grandfather   . Asthma Maternal Aunt    Family Psychiatric  History: None reported Social History:  Social History   Substance and Sexual Activity  Alcohol Use No     Social History   Substance and Sexual Activity  Drug Use No    Social History   Socioeconomic History  . Marital status: Single    Spouse name: Not on file  . Number of children: Not on file  . Years of education: Not on file  . Highest education level: Not on file  Occupational History  . Not on file  Social Needs  . Financial resource strain: Not on file  . Food insecurity    Worry: Not on file    Inability: Not on file  . Transportation needs    Medical: Not on file    Non-medical: Not on file  Tobacco Use  . Smoking status: Current Every Day Smoker    Packs/day: 0.50    Types: Cigarettes  . Smokeless tobacco: Never Used  Substance and Sexual Activity  . Alcohol use: No  . Drug use: No  . Sexual activity: Yes    Birth control/protection: Implant  Lifestyle  . Physical activity    Days per week: Not on file    Minutes per session: Not on file  . Stress: Not on file  Relationships  . Social Musicianconnections    Talks on phone: Not on file    Gets together: Not on file    Attends religious service: Not on file    Active member of club or organization: Not on file    Attends meetings of clubs or organizations: Not on file    Relationship status: Not on file  Other Topics Concern  . Not on file  Social History Narrative  . Not on file   Additional Social History:    Allergies:  No Known Allergies  Labs:  Results for orders placed  or performed during the hospital encounter of 04/12/19 (from the past 48 hour(s))  Acetaminophen level     Status: Abnormal   Collection Time: 04/12/19  9:16 PM  Result Value Ref Range   Acetaminophen (Tylenol), Serum <10 (L) 10 - 30 ug/mL    Comment: (NOTE) Therapeutic concentrations vary significantly. A range of 10-30 ug/mL  may be an effective concentration for many patients. However, some  are best treated at concentrations outside of this range. Acetaminophen concentrations >150 ug/mL at 4 hours after ingestion  and >50 ug/mL at 12 hours after ingestion are often associated with  toxic reactions. Performed at [facility], [address] [place], [place] [address]   Comprehensive metabolic panel     Status: Abnormal   Collection Time: 04/12/19  9:16 PM  Result Value Ref Range   Sodium 139 135 - 145 mmol/L   Potassium 3.5 3.5 - 5.1 mmol/L   Chloride 105 98 - 111 mmol/L   CO2 24 22 - 32 mmol/L   Glucose, Bld 112 (H) 70 - 99 mg/dL   BUN 15 6 - 20 mg/dL   Creatinine, Ser 1.610.91 0.44 - 1.00 mg/dL   Calcium 8.9 8.9 - 09.610.3 mg/dL   Total Protein 6.8 6.5 - 8.1 g/dL   Albumin 4.6 3.5 - 5.0 g/dL   AST 16 15 - 41 U/L   ALT 12 0 - 44 U/L   Alkaline Phosphatase 59 38 - 126 U/L   Total Bilirubin 0.6 0.3 - 1.2 mg/dL   GFR calc non Af Amer >60 >60 mL/min   GFR calc Af Amer >60 >60 mL/min   Anion gap 10 5 - 15    Comment: Performed at [facility], [address] [place], [place] [address]  Ethanol     Status: None   Collection Time: 04/12/19  9:16 PM  Result Value Ref Range   Alcohol, Ethyl (B) <10 <10 mg/dL    Comment: (NOTE) Lowest detectable limit for serum alcohol is 10 mg/dL. For medical purposes only. Performed at [facility], [address] [place], [place] [address]   Salicylate level     Status: None   Collection Time: 04/12/19  9:16 PM  Result Value Ref Range   Salicylate Lvl <7.0 2.8 - 30.0 mg/dL    Comment: Performed at [facility], [address] [place], [place]  [address]  CBC with Differential     Status: Abnormal   Collection Time: 04/12/19  9:16 PM  Result Value Ref Range   WBC 11.2 (H) 4.0 - 10.5 K/uL   RBC 4.40 3.87 - 5.11 MIL/uL   Hemoglobin 13.3 12.0 - 15.0 g/dL   HCT 95.639.9 21.336.0 - 08.646.0 %   MCV 90.7 80.0 - 100.0 fL   MCH 30.2 26.0 - 34.0 pg   MCHC 33.3 30.0 - 36.0 g/dL   RDW 57.812.3 46.911.5 - 62.915.5 %   Platelets 245 150 - 400 K/uL   nRBC 0.0 0.0 - 0.2 %   Neutrophils Relative % 48 %   Neutro Abs 5.4 1.7 - 7.7 K/uL   Lymphocytes Relative 41 %   Lymphs Abs 4.7 (H) 0.7 - 4.0 K/uL   Monocytes Relative 7 %   Monocytes Absolute 0.7 0.1 - 1.0 K/uL   Eosinophils Relative 3 %   Eosinophils Absolute 0.4 0.0 - 0.5 K/uL   Basophils Relative 1 %   Basophils Absolute 0.1 0.0 - 0.1 K/uL   Immature Granulocytes 0 %   Abs Immature Granulocytes 0.02 0.00 - 0.07 K/uL    Comment: Performed at [facility], [address] [place], [place] [address]  Protime-INR     Status: None   Collection Time: 04/12/19  9:16 PM  Result Value Ref Range   Prothrombin Time 13.3 11.4 - 15.2 seconds   INR 1.0 0.8 - 1.2    Comment: (NOTE) INR goal varies based on device and disease states. Performed at [facility], [address] [place], [place] [address]   hCG, serum, qualitative     Status: None   Collection Time: 04/12/19  9:16 PM  Result Value Ref Range   Preg, Serum NEGATIVE NEGATIVE    Comment:        THE SENSITIVITY OF THIS METHODOLOGY IS >10 mIU/mL. Performed at [facility], [address] [place], [place] [address]   Urine rapid drug screen (hosp performed)     Status: Abnormal   Collection Time: 04/12/19 11:14 PM  Result Value Ref Range   Opiates NONE DETECTED NONE DETECTED   Cocaine NONE DETECTED NONE DETECTED   Benzodiazepines POSITIVE (A) NONE DETECTED   Amphetamines NONE DETECTED NONE DETECTED   Tetrahydrocannabinol NONE DETECTED NONE DETECTED   Barbiturates NONE DETECTED NONE DETECTED    Comment: (NOTE) DRUG SCREEN FOR MEDICAL PURPOSES ONLY.  IF  CONFIRMATION IS NEEDED FOR ANY PURPOSE, NOTIFY LAB WITHIN 5 DAYS. LOWEST DETECTABLE LIMITS FOR URINE DRUG SCREEN Drug Class                     Cutoff (ng/mL) Amphetamine and metabolites    1000 Barbiturate and metabolites    200 Benzodiazepine                 200 Tricyclics and metabolites     300 Opiates and metabolites        300 Cocaine and metabolites        300 THC                            50 Performed at [facility], [address] [place], [place] [address]     Medications:  Current Facility-Administered Medications  Medication Dose Route Frequency Provider Last Rate Last Dose  . nicotine (NICODERM CQ - dosed in mg/24 hours) patch 14 mg  14 mg Transdermal Once Zadie RhineWickline, Donald, MD   14 mg at 04/13/19 0304   Current Outpatient Medications  Medication Sig Dispense Refill  . etonogestrel (NEXPLANON) 68 MG IMPL implant 1 each by Subdermal route once.     . megestrol (MEGACE) 40 MG tablet Take 3 tablets (120 mg total) by mouth daily. (Patient taking differently: Take 80 mg by mouth daily as needed (during menstrual for bleeding). ) 90 tablet 3    Musculoskeletal: Strength & Muscle Tone: within normal limits Gait & Station: normal Patient leans: N/A  Psychiatric Specialty Exam: Physical Exam  Nursing note and vitals reviewed. Constitutional: She is oriented to person, place, and time. She appears well-developed and well-nourished.  Cardiovascular: Normal rate.  Respiratory: Effort normal.  Musculoskeletal: Normal range of motion.  Neurological: She is alert and oriented to person, place, and time.  Skin: Skin is warm.    Review of Systems  Constitutional: Negative.   HENT: Negative.   Eyes: Negative.   Respiratory: Negative.   Cardiovascular: Negative.   Gastrointestinal: Negative.   Genitourinary: Negative.   Musculoskeletal: Negative.   Skin: Negative.   Neurological: Negative.   Endo/Heme/Allergies: Negative.   Psychiatric/Behavioral: Positive for  substance abuse.    Blood pressure 110/76, pulse 67, temperature 98 F (36.7 C), temperature source Oral, resp. rate 18, height 5\' 6"  (1.676 m), weight 54.4 kg, SpO2 99 %.Body mass index is 19.37 kg/m.  General Appearance: Casual  Eye Contact:  Good  Speech:  Clear and Coherent and Normal Rate  Volume:  Normal  Mood:  Euthymic  Affect:  Congruent  Thought Process:  Coherent and Descriptions of Associations: Intact  Orientation:  Full (Time, Place, and Person)  Thought Content:  WDL  Suicidal Thoughts:  No  Homicidal Thoughts:  No  Memory:  Immediate;   Good Recent;   Poor Remote;   Good  Judgement:  Fair  Insight:  Fair  Psychomotor Activity:  Normal  Concentration:  Concentration: Good and Attention Span: Good  Recall:  Good  Fund of Knowledge:  Good  Language:  Good  Akathisia:  No  Handed:  Right  AIMS (if indicated):     Assets:  Communication Skills Desire for Improvement Financial Resources/Insurance Housing Physical Health Social Support Transportation  ADL's:  Intact  Cognition:  WNL  Sleep:        Treatment Plan Summary: refused resources  Disposition: No evidence of imminent risk to self or others at present.   Patient does not meet criteria for psychiatric inpatient admission. Supportive therapy provided about ongoing stressors. Discussed crisis plan, support from social network, calling 911, coming to the Emergency Department, and calling Suicide Hotline.  This service was provided via telemedicine using a 2-way, interactive audio and video technology.  Names of all persons participating in this telemedicine service and their role in this encounter. Name: Crystal Crosby Role: Patient  Name: Marvia Pickles NP Role: provider  Name:  Role:   Name:  Role:     Lewis Shock, FNP 04/13/2019 9:21 AM

## 2019-04-13 NOTE — Progress Notes (Signed)
Per Marvia Pickles NP, pt is psychiatrically cleared. Pt declined resources. Darnelle Maffucci notified Helene Kelp (unit secretary)and Dr. Sabra Heck of disposition.   Anaria Kroner S. Ouida Sills, MSW, LCSW Clinical Social Worker 04/13/2019 9:21 AM

## 2019-04-13 NOTE — ED Notes (Addendum)
Patient's mother called. Mother updated and allowed to talk to patient.

## 2019-05-19 IMAGING — DX DG FOOT COMPLETE 3+V*R*
3 series · 3 of 3 positions shown · non-contrast
Comparison: 11/04/2015

CLINICAL DATA: Recent injury.  History of glass in foot 7 years ago

EXAM:
RIGHT FOOT COMPLETE - 3+ VIEW

[foot ap]
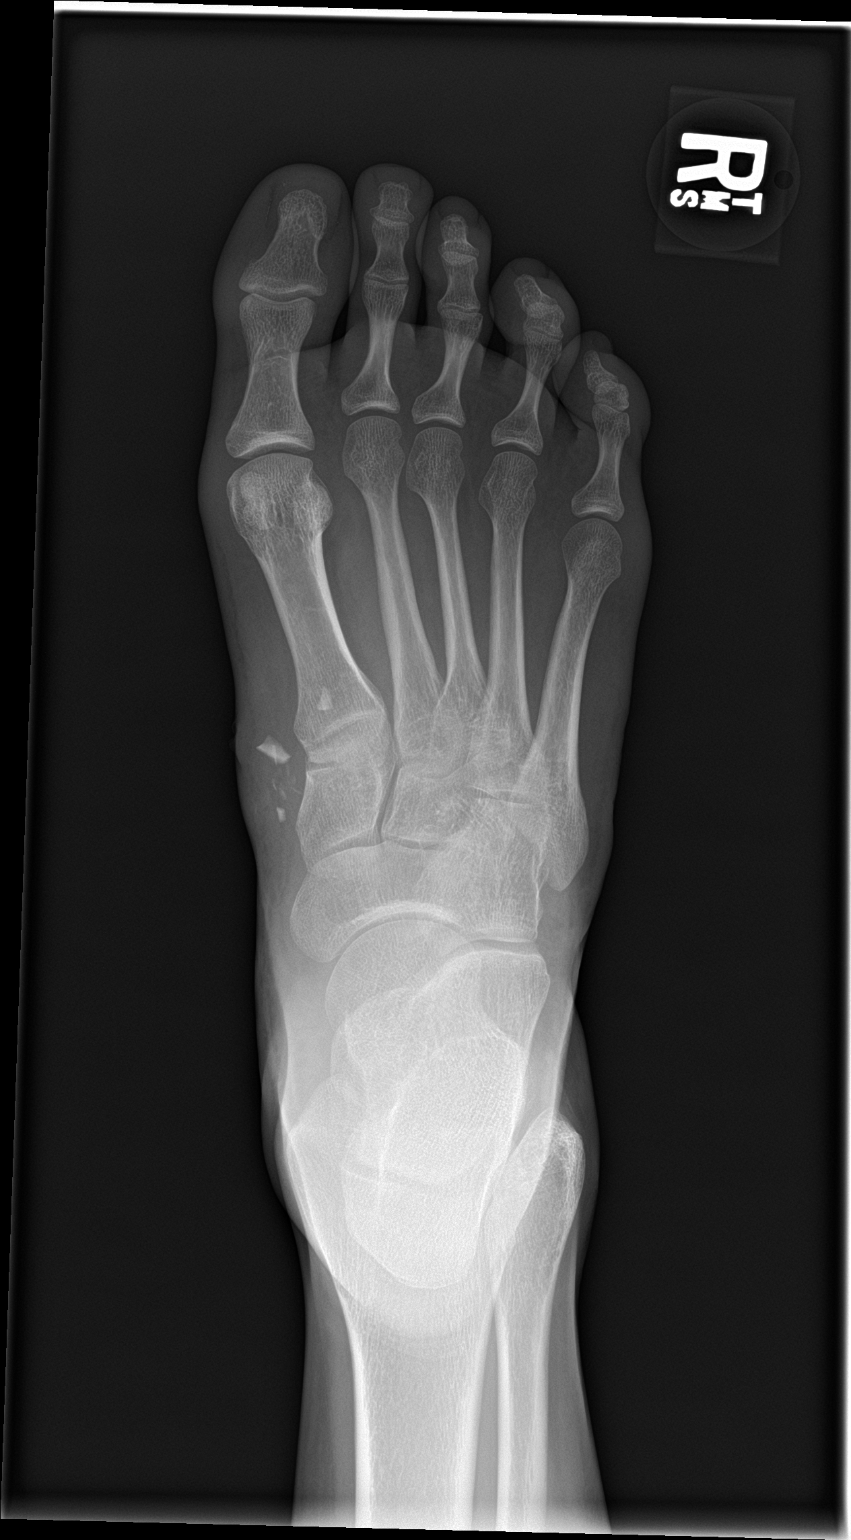

[foot obl]
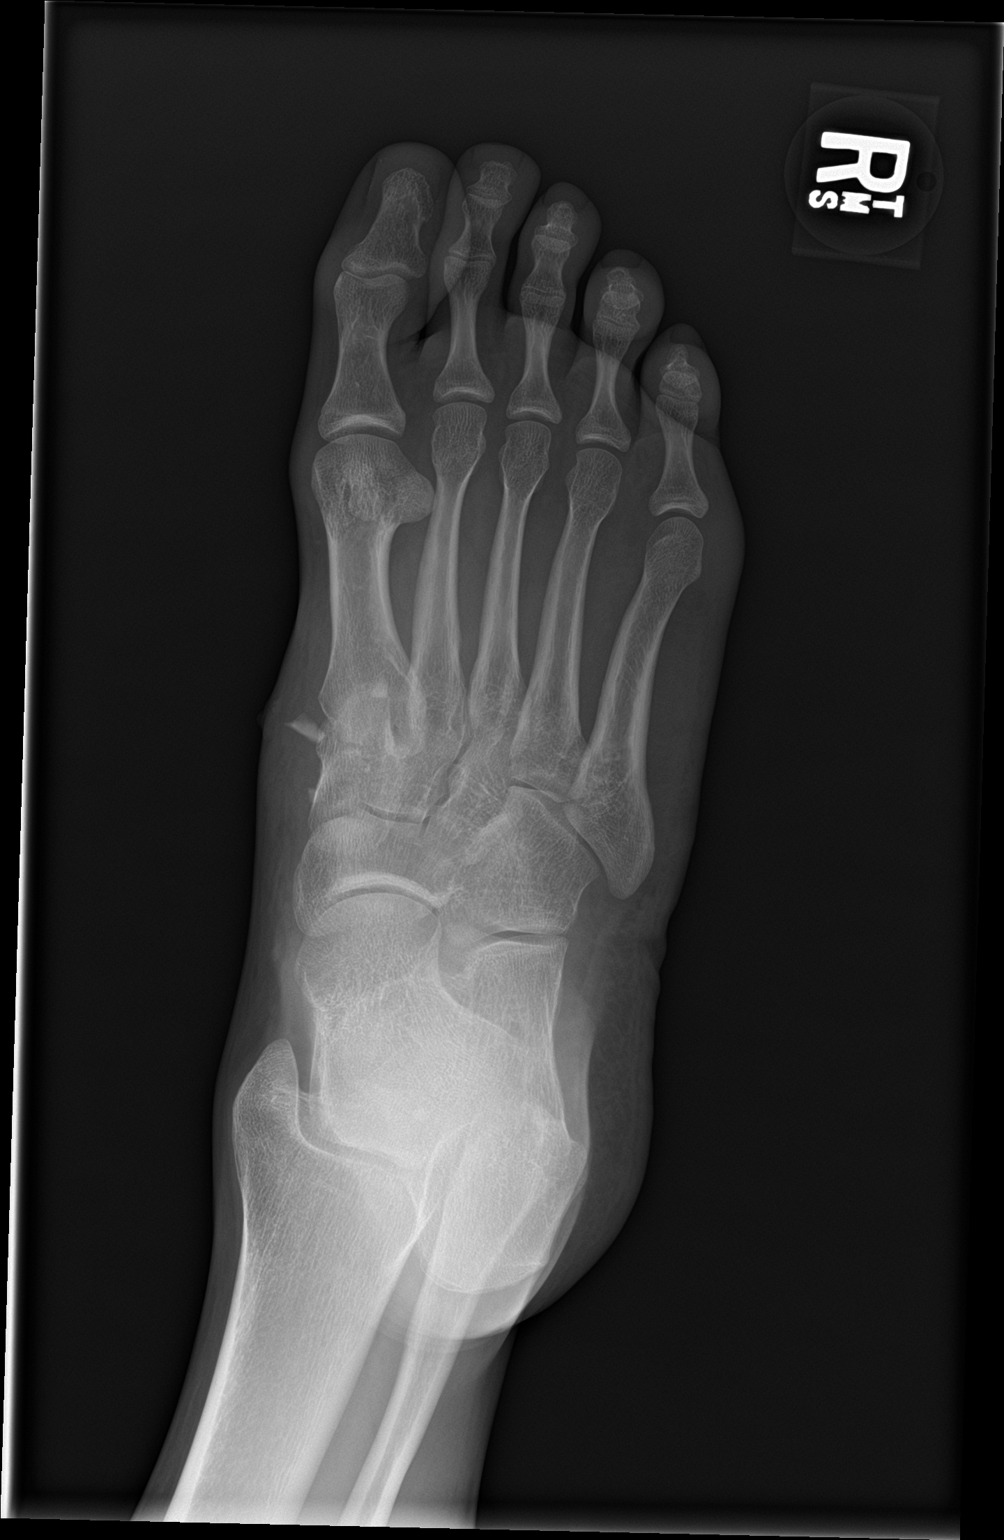

[foot lat]
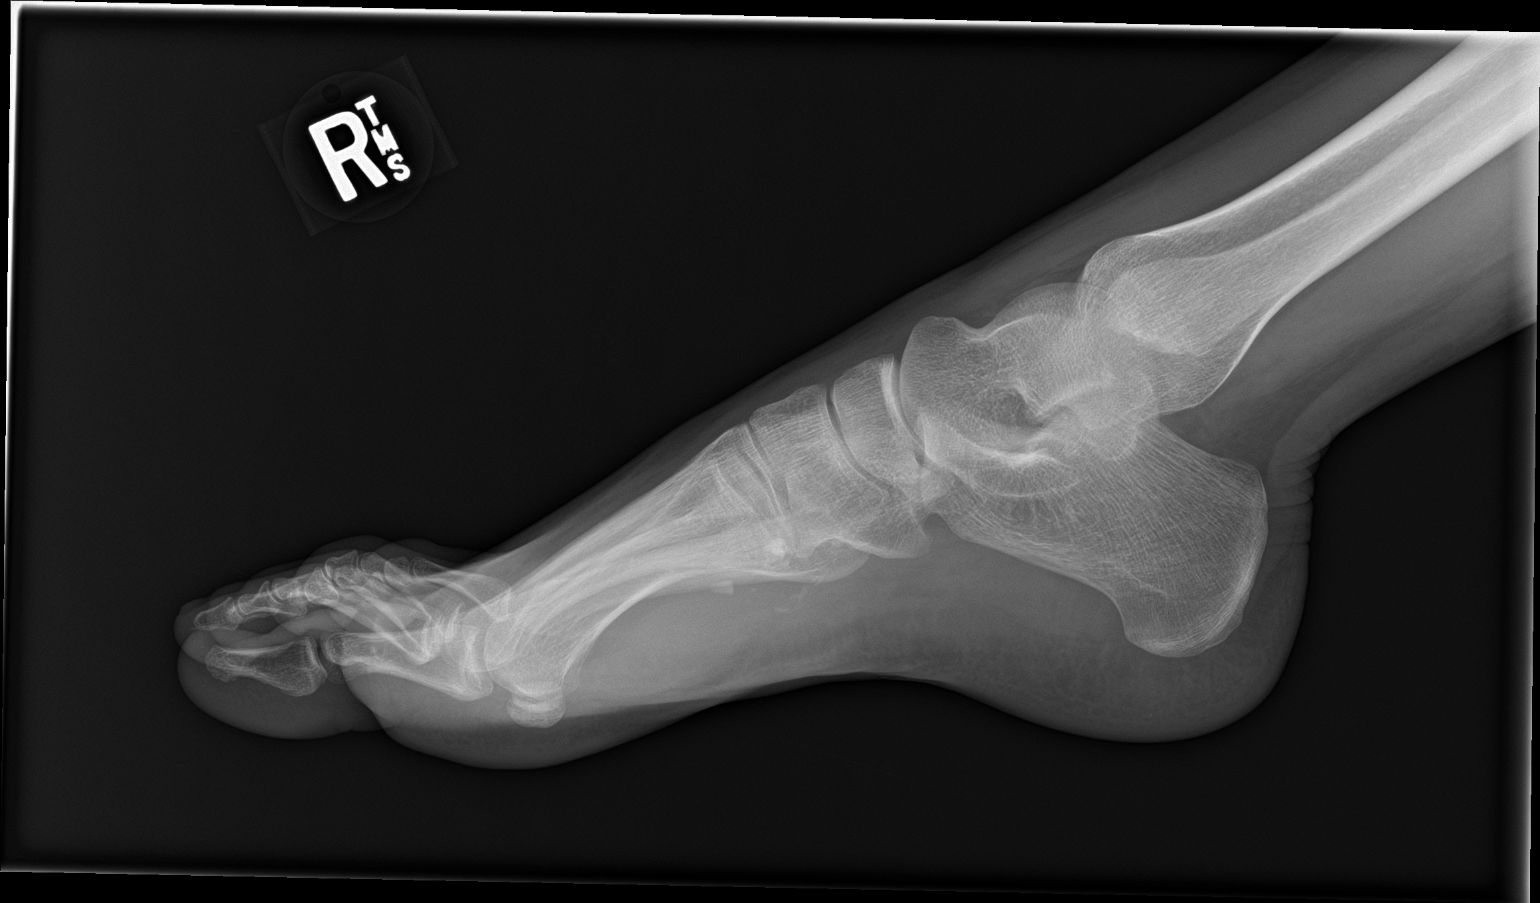

[3 of 3 positions shown; findings below may reference images not displayed]

FINDINGS: Negative for fracture.  Negative for arthropathy

Several fragments of glass are present in the soft tissues medial to
the first tarsal metatarsal joint..
IMPRESSION: Chronic glass foreign bodies in the medial soft tissues. Negative
for fracture.

## 2019-10-24 ENCOUNTER — Other Ambulatory Visit: Payer: Medicaid Other | Admitting: Adult Health

## 2020-09-04 ENCOUNTER — Other Ambulatory Visit: Payer: Self-pay

## 2020-09-04 ENCOUNTER — Ambulatory Visit
Admission: EM | Admit: 2020-09-04 | Discharge: 2020-09-04 | Disposition: A | Discharge status: Home / Self Care | Payer: Self-pay

## 2020-09-04 NOTE — ED Triage Notes (Signed)
Pt wanted to get tested for herpes but doesn't have symptoms, declines any other std screen. Advised would need to have outbreak to test. No visit made

## 2022-12-31 ENCOUNTER — Emergency Department (HOSPITAL_COMMUNITY)
Admission: EM | Admit: 2022-12-31 | Discharge: 2022-12-31 | Discharge status: Against Medical Advice | Payer: Self-pay | Attending: Emergency Medicine | Admitting: Emergency Medicine

## 2022-12-31 ENCOUNTER — Other Ambulatory Visit: Payer: Self-pay

## 2022-12-31 ENCOUNTER — Encounter (HOSPITAL_COMMUNITY): Payer: Self-pay

## 2022-12-31 DIAGNOSIS — R0981 Nasal congestion: Secondary | ICD-10-CM | POA: Insufficient documentation

## 2022-12-31 DIAGNOSIS — R110 Nausea: Secondary | ICD-10-CM | POA: Insufficient documentation

## 2022-12-31 DIAGNOSIS — Z1152 Encounter for screening for COVID-19: Secondary | ICD-10-CM | POA: Insufficient documentation

## 2022-12-31 DIAGNOSIS — R059 Cough, unspecified: Secondary | ICD-10-CM | POA: Insufficient documentation

## 2022-12-31 DIAGNOSIS — R519 Headache, unspecified: Secondary | ICD-10-CM | POA: Insufficient documentation

## 2022-12-31 LAB — RESP PANEL BY RT-PCR (RSV, FLU A&B, COVID)  RVPGX2
Influenza A by PCR: NEGATIVE
Influenza B by PCR: NEGATIVE
Resp Syncytial Virus by PCR: NEGATIVE
SARS Coronavirus 2 by RT PCR: NEGATIVE

## 2022-12-31 NOTE — ED Triage Notes (Signed)
Pt reports cough, headache, nasal congestion and nausea x 1 week.
